# Patient Record
Sex: Female | Born: 1940 | Race: White | Hispanic: No | Marital: Married | Smoking: Never smoker
Health system: Southern US, Community
[De-identification: ages and names within clinical notes are randomized; demographics above are authoritative.]

## PROBLEM LIST (undated history)

## (undated) DIAGNOSIS — Z923 Personal history of irradiation: Secondary | ICD-10-CM

## (undated) DIAGNOSIS — K219 Gastro-esophageal reflux disease without esophagitis: Secondary | ICD-10-CM

## (undated) DIAGNOSIS — F419 Anxiety disorder, unspecified: Secondary | ICD-10-CM

## (undated) DIAGNOSIS — E039 Hypothyroidism, unspecified: Secondary | ICD-10-CM

## (undated) DIAGNOSIS — C4370 Malignant melanoma of unspecified lower limb, including hip: Secondary | ICD-10-CM

## (undated) DIAGNOSIS — K5790 Diverticulosis of intestine, part unspecified, without perforation or abscess without bleeding: Secondary | ICD-10-CM

## (undated) DIAGNOSIS — C73 Malignant neoplasm of thyroid gland: Secondary | ICD-10-CM

## (undated) DIAGNOSIS — C801 Malignant (primary) neoplasm, unspecified: Secondary | ICD-10-CM

## (undated) DIAGNOSIS — K279 Peptic ulcer, site unspecified, unspecified as acute or chronic, without hemorrhage or perforation: Secondary | ICD-10-CM

## (undated) HISTORY — PX: MELANOMA EXCISION: SHX5266

## (undated) HISTORY — PX: COLON SURGERY: SHX602

## (undated) HISTORY — PX: THYROIDECTOMY: SHX17

---

## 2005-01-25 ENCOUNTER — Ambulatory Visit: Payer: Self-pay | Admitting: Gastroenterology

## 2005-01-30 ENCOUNTER — Ambulatory Visit: Payer: Self-pay | Admitting: Gastroenterology

## 2005-03-30 ENCOUNTER — Ambulatory Visit: Payer: Self-pay | Admitting: General Surgery

## 2005-09-18 ENCOUNTER — Ambulatory Visit: Payer: Self-pay | Admitting: Family Medicine

## 2006-05-09 ENCOUNTER — Ambulatory Visit: Payer: Self-pay | Admitting: Unknown Physician Specialty

## 2007-06-17 ENCOUNTER — Ambulatory Visit: Payer: Self-pay | Admitting: Internal Medicine

## 2007-06-17 ENCOUNTER — Other Ambulatory Visit: Payer: Self-pay

## 2007-06-18 ENCOUNTER — Inpatient Hospital Stay: Payer: Self-pay | Admitting: Surgery

## 2008-01-02 ENCOUNTER — Ambulatory Visit: Payer: Self-pay | Admitting: Family Medicine

## 2009-02-21 ENCOUNTER — Ambulatory Visit: Payer: Self-pay | Admitting: Family Medicine

## 2009-10-03 ENCOUNTER — Ambulatory Visit: Payer: Self-pay | Admitting: Gastroenterology

## 2009-10-12 ENCOUNTER — Ambulatory Visit: Payer: Self-pay | Admitting: Gastroenterology

## 2009-11-01 ENCOUNTER — Ambulatory Visit: Payer: Self-pay | Admitting: Gastroenterology

## 2009-11-03 LAB — PATHOLOGY REPORT

## 2009-12-09 ENCOUNTER — Ambulatory Visit: Payer: Self-pay | Admitting: Gastroenterology

## 2010-03-30 ENCOUNTER — Ambulatory Visit: Payer: Self-pay | Admitting: Family Medicine

## 2010-09-27 ENCOUNTER — Ambulatory Visit: Payer: Self-pay | Admitting: Family Medicine

## 2010-10-11 ENCOUNTER — Ambulatory Visit: Payer: Self-pay | Admitting: Surgery

## 2011-04-20 ENCOUNTER — Ambulatory Visit: Payer: Self-pay | Admitting: Family Medicine

## 2011-06-14 ENCOUNTER — Other Ambulatory Visit: Payer: Self-pay | Admitting: Gastroenterology

## 2011-06-15 LAB — CLOSTRIDIUM DIFFICILE BY PCR

## 2011-06-22 ENCOUNTER — Ambulatory Visit: Payer: Self-pay | Admitting: Gastroenterology

## 2011-07-02 ENCOUNTER — Ambulatory Visit: Payer: Self-pay | Admitting: Gastroenterology

## 2012-01-29 ENCOUNTER — Ambulatory Visit: Payer: Self-pay | Admitting: Gastroenterology

## 2012-01-31 LAB — PATHOLOGY REPORT

## 2012-04-22 ENCOUNTER — Ambulatory Visit: Payer: Self-pay | Admitting: Pediatrics

## 2012-11-04 IMAGING — CT CT ABD-PELV W/O CM
1 of 2 series · 15 of 32 positions shown, 19 images · non-contrast
Comparison: none

REASON FOR EXAM: RLQ pain
COMMENTS:

PROCEDURE:     EKOMOBONG - EKOMOBONG ABDOMEN AND PELVIS WO  - July 02, 2011 [DATE]
RESULT:
TECHNIQUE: CT of the abdomen and pelvis is performed with oral contrast
only. Images are reconstructed at 3.0 mm slice thickness in the axial plane.

[Series 3: soft tissue · axial · 0.76mm/px · z∈[-423,-87]mm · 15 of 122 slices shown, 19 images]
[im 5/122  soft-tissue]
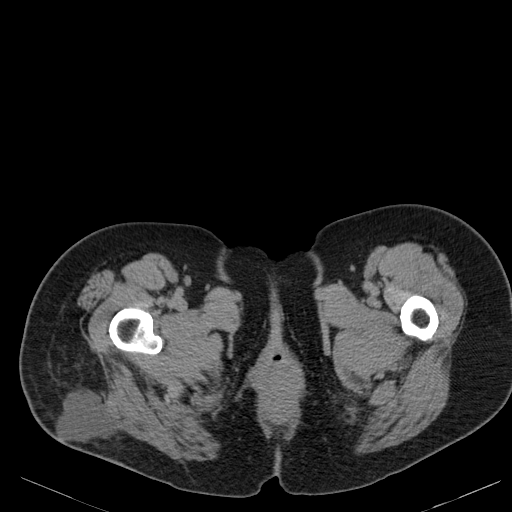
[im 5/122  bone]
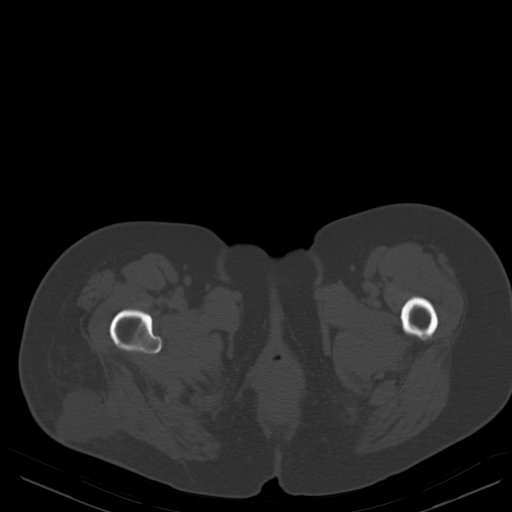
[im 15/122  soft-tissue]
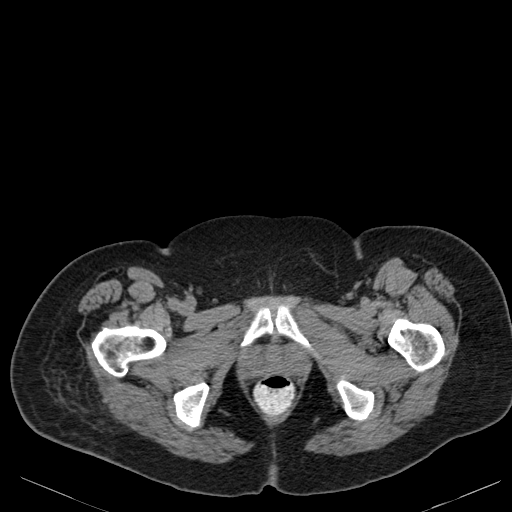
[im 25/122  soft-tissue]
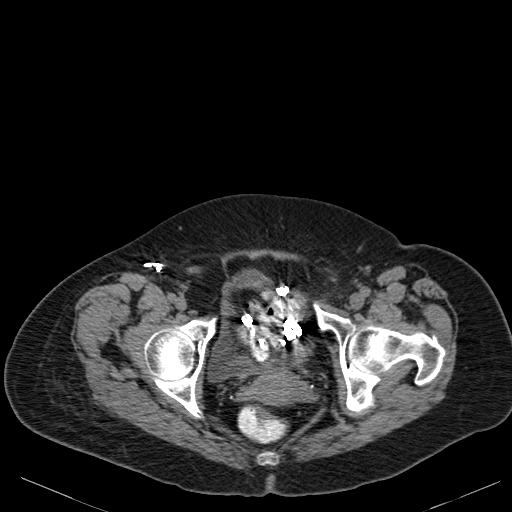
[im 34/122  soft-tissue]
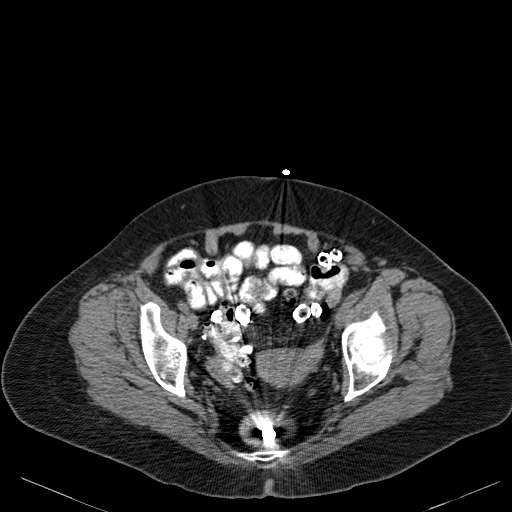
[im 44/122  soft-tissue]
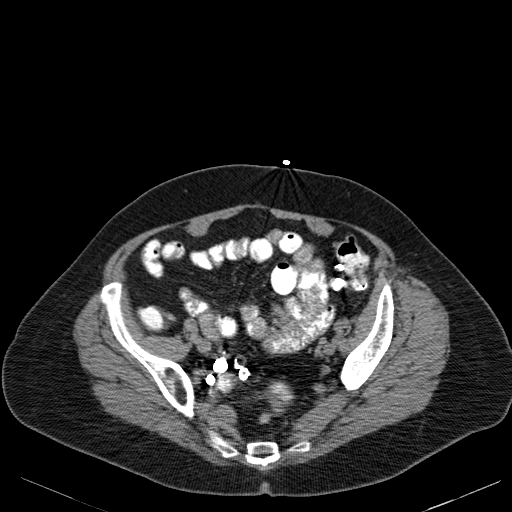
[im 54/122  soft-tissue]
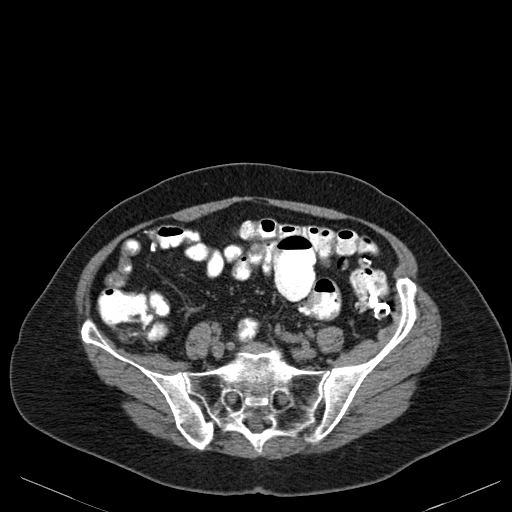
[im 63/122  soft-tissue]
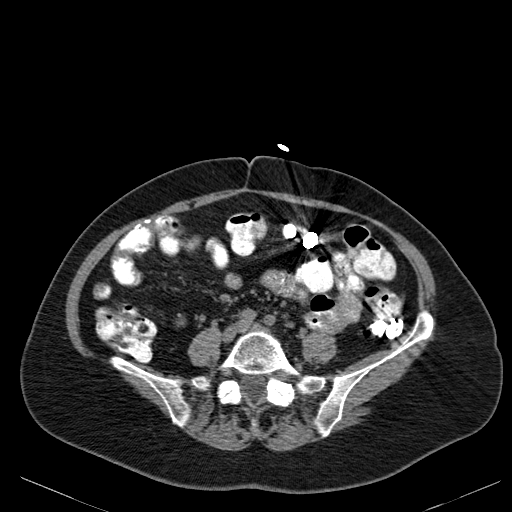
[im 68/122  soft-tissue]
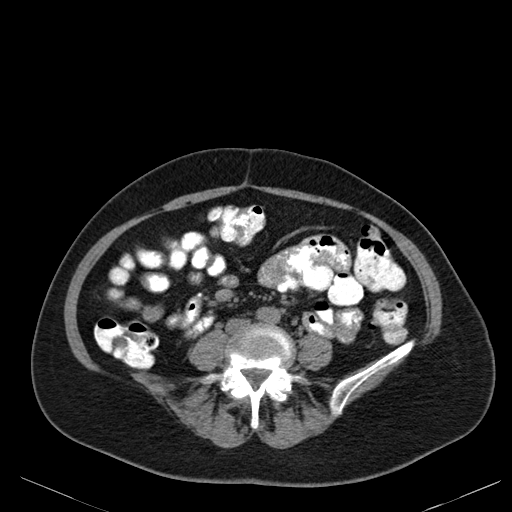
[im 78/122  soft-tissue]
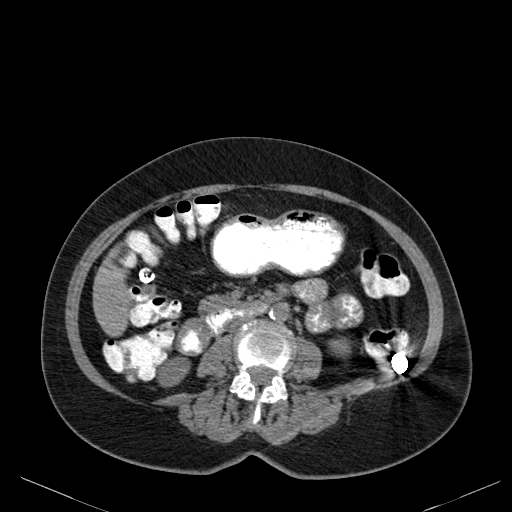
[im 78/122  bone]
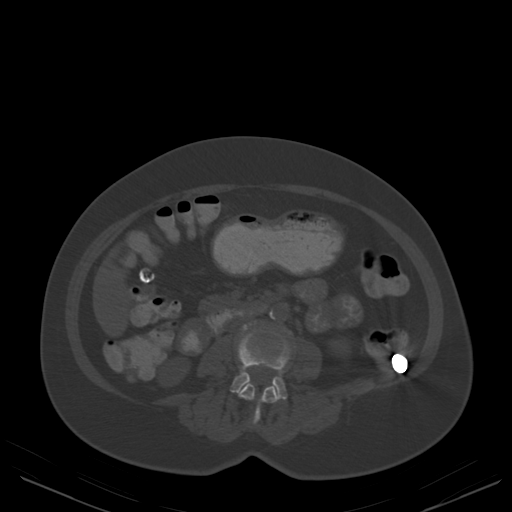
[im 88/122  soft-tissue]
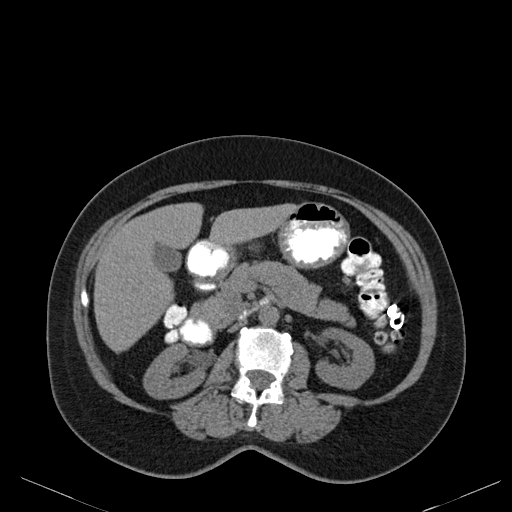
[im 97/122  soft-tissue]
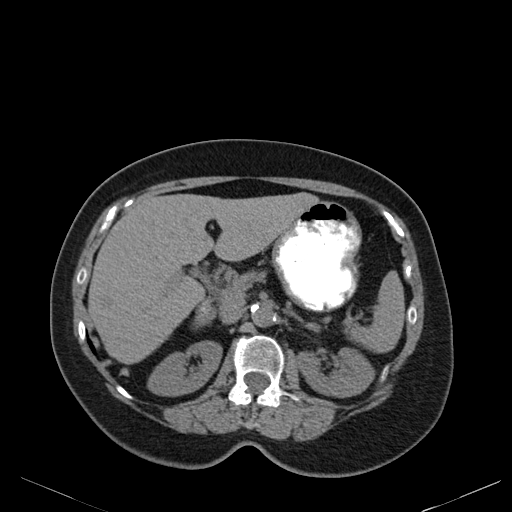
[im 102/122  lung]
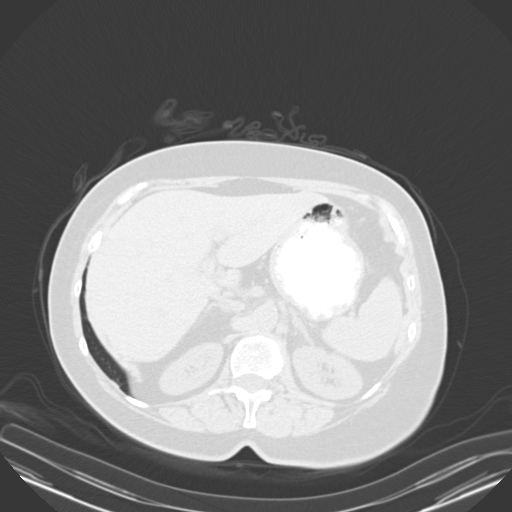
[im 107/122  soft-tissue]
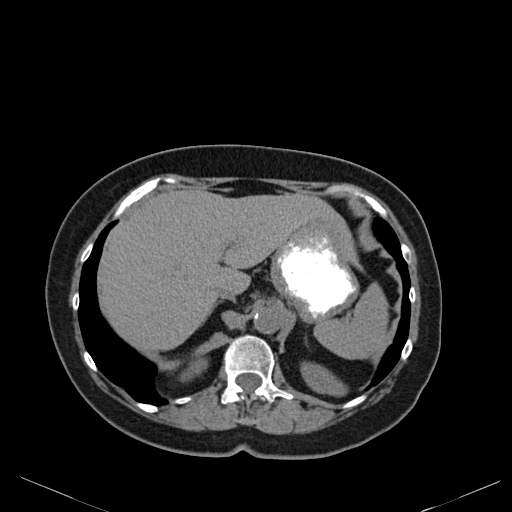
[im 107/122  lung]
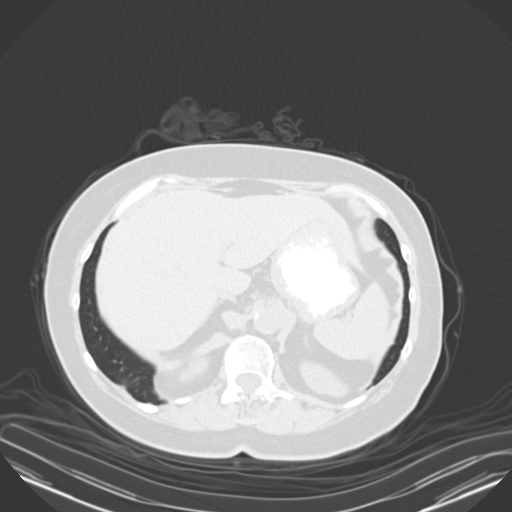
[im 112/122  lung]
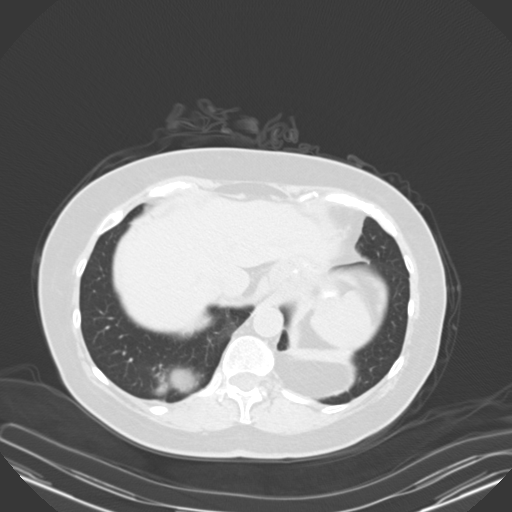
[im 117/122  soft-tissue]
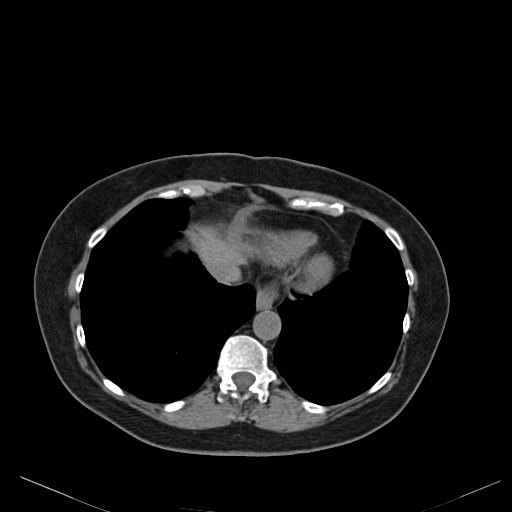
[im 117/122  lung]
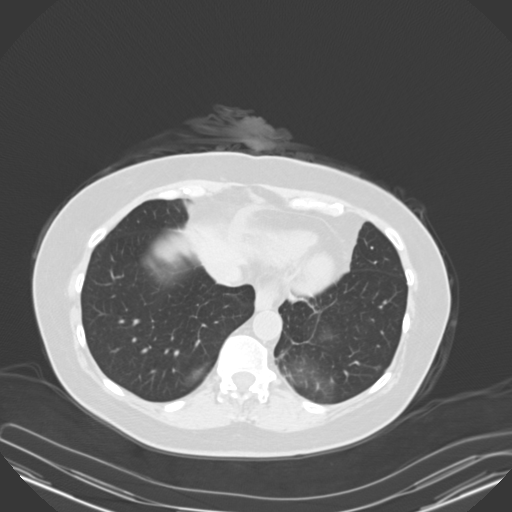

[15 of 32 positions shown; findings below may reference images not displayed]

There is residual barium from the Small Bowel Series performed on 22 June, 2011. This is predominantly in the sigmoid colon and rectum but also in some
colonic diverticula in the descending colon.

Images through the lung bases demonstrate no focal mass. There is no
infiltrate, edema or pneumothorax. Noncontrast images of the liver,
gallbladder, spleen and kidneys show no acute abnormality or focal abnormal
attenuation. The adrenal glands and aorta appear unremarkable. No pancreatic
mass or ductal dilation is evident. An extrarenal pelvis is present on the
left. No adenopathy is appreciated. A borderline enlarged lymph node is
present on image #52 with a short axis measurement of 7.2 mm diameter. An
atrophic uterus is present. No acute inflammation, ascites or abnormal fluid
collection is evident. Surgical clips are seen over the right groin region.
The bony structures appear unremarkable.
IMPRESSION: 1.     Colonic diverticulosis without evidence of acute diverticulitis.
2.     Slightly prominent retroperitoneal lymph nodes without adenopathy by
pathologic criteria.

## 2013-04-24 ENCOUNTER — Ambulatory Visit: Payer: Self-pay | Admitting: Pediatrics

## 2013-07-16 ENCOUNTER — Other Ambulatory Visit: Payer: Self-pay | Admitting: Gastroenterology

## 2013-07-16 LAB — CLOSTRIDIUM DIFFICILE(ARMC)

## 2013-07-19 LAB — STOOL CULTURE

## 2014-01-26 ENCOUNTER — Ambulatory Visit: Payer: Self-pay | Admitting: Pediatrics

## 2014-02-13 ENCOUNTER — Emergency Department: Payer: Self-pay | Admitting: Emergency Medicine

## 2014-06-11 ENCOUNTER — Ambulatory Visit: Payer: Self-pay | Admitting: Gastroenterology

## 2014-06-22 ENCOUNTER — Ambulatory Visit
Admit: 2014-06-22 | Disposition: A | Discharge status: Home / Self Care | Payer: Self-pay | Attending: Pediatrics | Admitting: Pediatrics

## 2014-10-18 ENCOUNTER — Encounter: Payer: Self-pay | Admitting: *Deleted

## 2014-10-19 ENCOUNTER — Ambulatory Visit: Payer: Medicare Other | Admitting: Anesthesiology

## 2014-10-19 ENCOUNTER — Encounter
Admission: RE | Disposition: A | Discharge status: Home / Self Care | Payer: Self-pay | Source: Ambulatory Visit | Attending: Gastroenterology

## 2014-10-19 ENCOUNTER — Encounter: Payer: Self-pay | Admitting: *Deleted

## 2014-10-19 ENCOUNTER — Ambulatory Visit
Admission: RE | Admit: 2014-10-19 | Discharge: 2014-10-19 | Disposition: A | Discharge status: Home / Self Care | Payer: Medicare Other | Source: Ambulatory Visit | Attending: Gastroenterology | Admitting: Gastroenterology

## 2014-10-19 DIAGNOSIS — Z85820 Personal history of malignant melanoma of skin: Secondary | ICD-10-CM | POA: Insufficient documentation

## 2014-10-19 DIAGNOSIS — Z79899 Other long term (current) drug therapy: Secondary | ICD-10-CM | POA: Insufficient documentation

## 2014-10-19 DIAGNOSIS — F419 Anxiety disorder, unspecified: Secondary | ICD-10-CM | POA: Diagnosis not present

## 2014-10-19 DIAGNOSIS — K224 Dyskinesia of esophagus: Secondary | ICD-10-CM | POA: Diagnosis not present

## 2014-10-19 DIAGNOSIS — K449 Diaphragmatic hernia without obstruction or gangrene: Secondary | ICD-10-CM | POA: Diagnosis not present

## 2014-10-19 DIAGNOSIS — K219 Gastro-esophageal reflux disease without esophagitis: Secondary | ICD-10-CM | POA: Diagnosis not present

## 2014-10-19 DIAGNOSIS — K317 Polyp of stomach and duodenum: Secondary | ICD-10-CM | POA: Diagnosis not present

## 2014-10-19 DIAGNOSIS — R1013 Epigastric pain: Secondary | ICD-10-CM | POA: Diagnosis not present

## 2014-10-19 DIAGNOSIS — K2970 Gastritis, unspecified, without bleeding: Secondary | ICD-10-CM | POA: Insufficient documentation

## 2014-10-19 DIAGNOSIS — Z85850 Personal history of malignant neoplasm of thyroid: Secondary | ICD-10-CM | POA: Diagnosis not present

## 2014-10-19 DIAGNOSIS — R1031 Right lower quadrant pain: Secondary | ICD-10-CM | POA: Diagnosis present

## 2014-10-19 DIAGNOSIS — Z880 Allergy status to penicillin: Secondary | ICD-10-CM | POA: Insufficient documentation

## 2014-10-19 DIAGNOSIS — R197 Diarrhea, unspecified: Secondary | ICD-10-CM | POA: Insufficient documentation

## 2014-10-19 DIAGNOSIS — K5730 Diverticulosis of large intestine without perforation or abscess without bleeding: Secondary | ICD-10-CM | POA: Diagnosis not present

## 2014-10-19 DIAGNOSIS — E039 Hypothyroidism, unspecified: Secondary | ICD-10-CM | POA: Diagnosis not present

## 2014-10-19 HISTORY — DX: Malignant neoplasm of thyroid gland: C73

## 2014-10-19 HISTORY — DX: Hypothyroidism, unspecified: E03.9

## 2014-10-19 HISTORY — DX: Peptic ulcer, site unspecified, unspecified as acute or chronic, without hemorrhage or perforation: K27.9

## 2014-10-19 HISTORY — DX: Gastro-esophageal reflux disease without esophagitis: K21.9

## 2014-10-19 HISTORY — DX: Malignant (primary) neoplasm, unspecified: C80.1

## 2014-10-19 HISTORY — DX: Anxiety disorder, unspecified: F41.9

## 2014-10-19 HISTORY — PX: COLONOSCOPY WITH PROPOFOL: SHX5780

## 2014-10-19 HISTORY — PX: ESOPHAGOGASTRODUODENOSCOPY: SHX5428

## 2014-10-19 HISTORY — DX: Diverticulosis of intestine, part unspecified, without perforation or abscess without bleeding: K57.90

## 2014-10-19 HISTORY — DX: Malignant melanoma of unspecified lower limb, including hip: C43.70

## 2014-10-19 SURGERY — COLONOSCOPY WITH PROPOFOL
Anesthesia: General

## 2014-10-19 MED ORDER — MIDAZOLAM HCL 5 MG/5ML IJ SOLN
INTRAMUSCULAR | Status: DC | PRN
Start: 2014-10-19 — End: 2014-10-19
  Administered 2014-10-19: 1 mg via INTRAVENOUS

## 2014-10-19 MED ORDER — SODIUM CHLORIDE 0.9 % IV SOLN
INTRAVENOUS | Status: DC
  Administered 2014-10-19: 10:00:00 via INTRAVENOUS

## 2014-10-19 MED ORDER — PROPOFOL INFUSION 10 MG/ML OPTIME
INTRAVENOUS | Status: DC | PRN
Start: 2014-10-19 — End: 2014-10-19
  Administered 2014-10-19: 160 ug/kg/min via INTRAVENOUS

## 2014-10-19 MED ORDER — GLYCOPYRROLATE 0.2 MG/ML IJ SOLN
INTRAMUSCULAR | Status: DC | PRN
  Administered 2014-10-19: 0.2 mg via INTRAVENOUS

## 2014-10-19 MED ORDER — SODIUM CHLORIDE 0.9 % IV SOLN
INTRAVENOUS | Status: DC
Start: 2014-10-19 — End: 2014-10-19
  Administered 2014-10-19: 12:00:00 via INTRAVENOUS

## 2014-10-19 MED ORDER — FENTANYL CITRATE (PF) 100 MCG/2ML IJ SOLN
INTRAMUSCULAR | Status: DC | PRN
  Administered 2014-10-19: 50 ug via INTRAVENOUS

## 2014-10-19 MED ORDER — EPHEDRINE SULFATE 50 MG/ML IJ SOLN
INTRAMUSCULAR | Status: DC | PRN
  Administered 2014-10-19: 10 mg via INTRAVENOUS

## 2014-10-19 MED ORDER — PROPOFOL 10 MG/ML IV BOLUS
INTRAVENOUS | Status: DC | PRN
  Administered 2014-10-19: 50 mg via INTRAVENOUS
  Administered 2014-10-19: 30 mg via INTRAVENOUS

## 2014-10-19 NOTE — Transfer of Care (Signed)
Immediate Anesthesia Transfer of Care Note  Patient: Savannah Riley  Procedure(s) Performed: Procedure(s): COLONOSCOPY WITH PROPOFOL (N/A) ESOPHAGOGASTRODUODENOSCOPY (EGD) (N/A)  Patient Location:    Anesthesia Type:General  Level of Consciousness: sedated  Airway & Oxygen Therapy: Patient Spontanous Breathing and Patient connected to nasal cannula oxygen  Post-op Assessment: Report given to RN and Post -op Vital signs reviewed and stable  Post vital signs: Reviewed and stable  Last Vitals:  Filed Vitals:   10/19/14 1225  BP:   Pulse: 95  Temp: 35.7 C  Resp: 12    Complications: No apparent anesthesia complications

## 2014-10-19 NOTE — Anesthesia Preprocedure Evaluation (Signed)
Anesthesia Evaluation  Patient identified by MRN, date of birth, ID band Patient awake    Reviewed: Allergy & Precautions, NPO status , Patient's Chart, lab work & pertinent test results  History of Anesthesia Complications Negative for: history of anesthetic complications  Airway Mallampati: III       Dental  (+) Teeth Intact, Caps   Pulmonary neg pulmonary ROS,    Pulmonary exam normal       Cardiovascular negative cardio ROS Normal cardiovascular exam    Neuro/Psych    GI/Hepatic Neg liver ROS, PUD, GERD-  Medicated,  Endo/Other  Hypothyroidism   Renal/GU negative Renal ROS     Musculoskeletal negative musculoskeletal ROS (+)   Abdominal Normal abdominal exam  (+)   Peds negative pediatric ROS (+)  Hematology negative hematology ROS (+)   Anesthesia Other Findings   Reproductive/Obstetrics negative OB ROS                             Anesthesia Physical Anesthesia Plan  ASA: II  Anesthesia Plan: General   Post-op Pain Management:    Induction: Intravenous  Airway Management Planned: Nasal Cannula  Additional Equipment:   Intra-op Plan:   Post-operative Plan:   Informed Consent: I have reviewed the patients History and Physical, chart, labs and discussed the procedure including the risks, benefits and alternatives for the proposed anesthesia with the patient or authorized representative who has indicated his/her understanding and acceptance.     Plan Discussed with: CRNA  Anesthesia Plan Comments:         Anesthesia Quick Evaluation

## 2014-10-19 NOTE — Op Note (Signed)
[facility] Gastroenterology Patient Name: Savannah Riley Procedure Date: 10/19/2014 11:44 AM MRN: [id number] Account #: [id number] Date of Birth: [birth year] Admit Type: Outpatient Age: 74 Room: [facility] ENDO ROOM 3 Gender: Female Note Status: Finalized Procedure:         Upper GI endoscopy Indications:       Dyspepsia, Diarrhea Providers:         Lollie Sails, MD Referring MD:      Shonna Chock, MD (Referring MD) Medicines:         Monitored Anesthesia Care Complications:     No immediate complications. Procedure:         Pre-Anesthesia Assessment:                    - ASA Grade Assessment: III - A patient with severe                     systemic disease.                    After obtaining informed consent, the endoscope was passed                     under direct vision. Throughout the procedure, the                     patient's blood pressure, pulse, and oxygen saturations                     were monitored continuously. The Olympus GIF-160 endoscope                     (S#. [id number]) was introduced through the mouth, and                     advanced to the fourth part of duodenum. The upper GI                     endoscopy was accomplished without difficulty. The patient                     tolerated the procedure well. Findings:      Abnormal motility was noted in the middle third of the esophagus and in       the lower third of the esophagus. The cricopharyngeus was normal. There       is spasticity of the esophageal body. The distal esophagus/lower       esophageal sphincter is open. Tertiary peristaltic waves are noted.      A small hiatus hernia was found. The Z-line was a variable distance from       incisors; the hiatal hernia was sliding. Biopsies were taken with a cold       forceps for histology.      Diffuse minimal inflammation characterized by erythema was found in the       gastric body.      Biopsies were taken with a cold forceps in the gastric  body and in the       gastric antrum for histology.      A single 3 mm sessile polyp with no bleeding and stigmata of recent       bleeding was found on the anterior wall of the gastric body. The polyp       was removed with a cold biopsy forceps. Resection and retrieval  were       complete.      The exam of the stomach was otherwise normal.      Diffuse mucosal flattening was found in the entire examined duodenum.       This was biopsied with a cold forceps for histology. Impression:        - Esophageal motility disorder.                    - Small hiatus hernia. Biopsied.                    - Bile gastritis.                    - A single gastric polyp. Resected and retrieved.                    - Flattened mucosa was found in the duodenum, not                     consistent with celiac disease. Biopsied.                    - Biopsies were taken with a cold forceps for histology in                     the gastric body and in the gastric antrum. Recommendation:    - Await pathology results.                    - Discharge patient to home.                    - Use sucralfate suspension 1 gram PO QID daily.                    - Use Prilosec (omeprazole) 20 mg PO daily daily. Procedure Code(s): --- Professional ---                    [phone], Esophagogastroduodenoscopy, flexible, transoral;                     with biopsy, single or multiple Diagnosis Code(s): --- Professional ---                    530.5, Dyskinesia of esophagus                    535.40, Other specified gastritis, without mention of                     hemorrhage                    211.1, Benign neoplasm of stomach                    537.9, Unspecified disorder of stomach and duodenum                    536.8, Dyspepsia and other specified disorders of function                     of stomach                    787.91, Diarrhea  553.3, Diaphragmatic hernia without mention of obstruction                      or gangrene CPT copyright 2014 American Medical Association. All rights reserved. The codes documented in this report are preliminary and upon coder review may  be revised to meet current compliance requirements. Lollie Sails, MD 10/19/2014 12:09:43 PM This report has been signed electronically. Number of Addenda: 0 Note Initiated On: 10/19/2014 11:44 AM      [facility]

## 2014-10-19 NOTE — H&P (Signed)
Outpatient short stay form Pre-procedure 10/19/2014 11:30 AM Savannah Sails MD  Primary Physician: Dr. 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 She has been relatively stable and these regards however as had increasing issues with diarrhea. Her last chromogranin A was only slightly elevated from her previous. She has been following with her visions at [facility] in this regard. There's been mesenteric lymphadenopathy. She has been taking some Carafate which has helped with the diarrhea. He also takes when necessary Bentyl. She has not been on the omeprazole recently due to the need for rechecking the chromogranin A.  She tolerated her prep well. He does not take any aspirin or NSAID type products or anticoagulates medications.    Current facility-administered medications:  .  0.9 %  sodium chloride infusion, , Intravenous, Continuous, Savannah Sails, MD, Last Rate: 20 mL/hr at 10/19/14 1022 .  0.9 %  sodium chloride infusion, , Intravenous, Continuous, Savannah Sails, MD  Prescriptions prior to admission  Medication Sig Dispense Refill Last Dose  . ALPRAZolam (XANAX) 0.5 MG tablet Take 0.5 mg by mouth daily as needed for anxiety.   10/18/2014 at Unknown time  . ALPRAZolam (XANAX) 0.5 MG tablet Take 0.5 mg by mouth at bedtime as needed for anxiety.   10/18/2014 at 2100  . dicyclomine (BENTYL) 10 MG capsule Take 10 mg by mouth 4 (four) times daily as needed for spasms.   Past Month at Unknown time  . levothyroxine (SYNTHROID, LEVOTHROID) 100 MCG tablet Take 100 mcg by mouth daily before breakfast.   10/19/2014 at 0600  . Multiple Vitamin (MULTIVITAMIN) tablet Take 1 tablet by mouth daily.   Past Week at Unknown time  . sucralfate (CARAFATE) 1 GM/10ML suspension Take 1 g by mouth 4 (four) times daily -  with meals and at  bedtime.   10/17/2014  . triamcinolone cream (KENALOG) 0.1 % Apply 1 application topically 2 (two) times daily as needed.   Past Month at Unknown time  . omeprazole (PRILOSEC) 20 MG capsule Take 20 mg by mouth daily.   Completed Course at Unknown time     Allergies  Allergen Reactions  . Ivp Dye [Iodinated Diagnostic Agents]   . Novocain [Procaine]   . Other   . Penicillins   . Phenergan [Promethazine Hcl]   . Prednisone      Past Medical History  Diagnosis Date  . Cancer     Carcinoid tumor of small intestine, malignant   . Papillary carcinoma of thyroid   . Melanoma of lower limb   . Hypothyroidism   . GERD (gastroesophageal reflux disease)   . Peptic ulcer disease   . Anxiety   . Diverticulosis     Review of systems:      Physical Exam    Heart and lungs: Regular rate and rhythm without rub or gallop, lungs are bilaterally clear    HEENT: Normocephalic atraumatic eyes are anicteric    Other:     Pertinant exam for procedure: Soft mild tenderness to palpation right lower quadrant. Bowel sounds are positive normoactive. There are no masses or organomegaly noted.    Planned proceedures: EGD and colonoscopy with indicated procedures I have discussed the risks benefits and complications of procedures to include not limited to bleeding, infection, perforation and the risk of sedation and the patient wishes to  proceed.    Savannah Sails, MD Gastroenterology 10/19/2014  11:30 AM

## 2014-10-19 NOTE — Op Note (Signed)
[facility] Gastroenterology Patient Name: Savannah Riley Procedure Date: 10/19/2014 11:42 AM MRN: [id number] Account #: [id number] Date of Birth: [birth year] Admit Type: Outpatient Age: 74 Room: [facility] ENDO ROOM 3 Gender: Female Note Status: Finalized Procedure:         Colonoscopy Indications:       Abdominal pain in the right lower quadrant, Clinically                     significant diarrhea of unexplained origin Providers:         Lollie Sails, MD Referring MD:      Shonna Chock, MD (Referring MD) Medicines:         Monitored Anesthesia Care Complications:     No immediate complications. Procedure:         Pre-Anesthesia Assessment:                    - ASA Grade Assessment: III - A patient with severe                     systemic disease.                    After obtaining informed consent, the colonoscope was                     passed under direct vision. Throughout the procedure, the                     patient's blood pressure, pulse, and oxygen saturations                     were monitored continuously. The Colonoscope was                     introduced through the anus with the intention of                     advancing to the ileum. The scope was advanced to the                     splenic flexure before the procedure was aborted.                     Medications were given. The colonoscopy was unusually                     difficult due to poor bowel prep with stool present. The                     quality of the bowel preparation was poor. Findings:      Multiple small and large-mouthed diverticula were found in the sigmoid       colon and in the descending colon.      The digital rectal exam was normal. Impression:        - Preparation of the colon was poor.                    - Diverticulosis in the sigmoid colon and in the                     descending colon.                    - No specimens collected. Recommendation:    -  Put patient on a  clear liquid diet starting today.                     Reschedule for proceedure in 2 days. Lollie Sails, MD 10/19/2014 12:23:28 PM This report has been signed electronically. Number of Addenda: 0 Note Initiated On: 10/19/2014 11:42 AM Total Procedure Duration: 0 hours 7 minutes 21 seconds       [facility]

## 2014-10-19 NOTE — Anesthesia Postprocedure Evaluation (Signed)
  Anesthesia Post-op Note  Patient: Savannah Riley  Procedure(s) Performed: Procedure(s): COLONOSCOPY WITH PROPOFOL (N/A) ESOPHAGOGASTRODUODENOSCOPY (EGD) (N/A)  Anesthesia type:General  Patient location: PACU  Post pain: Pain level controlled  Post assessment: Post-op Vital signs reviewed, Patient's Cardiovascular Status Stable, Respiratory Function Stable, Patent Airway and No signs of Nausea or vomiting  Post vital signs: Reviewed and stable  Last Vitals:  Filed Vitals:   10/19/14 1240  BP: 102/61  Pulse: 99  Temp:   Resp: 17    Level of consciousness: awake, alert  and patient cooperative  Complications: No apparent anesthesia complications

## 2014-10-20 ENCOUNTER — Encounter: Payer: Self-pay | Admitting: *Deleted

## 2014-10-20 LAB — SURGICAL PATHOLOGY

## 2014-10-21 ENCOUNTER — Ambulatory Visit: Payer: Medicare Other | Admitting: Anesthesiology

## 2014-10-21 ENCOUNTER — Encounter
Admission: RE | Disposition: A | Discharge status: Home / Self Care | Payer: Self-pay | Source: Ambulatory Visit | Attending: Gastroenterology

## 2014-10-21 ENCOUNTER — Encounter: Payer: Self-pay | Admitting: *Deleted

## 2014-10-21 ENCOUNTER — Ambulatory Visit
Admission: RE | Admit: 2014-10-21 | Discharge: 2014-10-21 | Disposition: A | Discharge status: Home / Self Care | Payer: Medicare Other | Source: Ambulatory Visit | Attending: Gastroenterology | Admitting: Gastroenterology

## 2014-10-21 DIAGNOSIS — K5730 Diverticulosis of large intestine without perforation or abscess without bleeding: Secondary | ICD-10-CM | POA: Diagnosis not present

## 2014-10-21 DIAGNOSIS — R1031 Right lower quadrant pain: Secondary | ICD-10-CM | POA: Diagnosis present

## 2014-10-21 DIAGNOSIS — K648 Other hemorrhoids: Secondary | ICD-10-CM | POA: Insufficient documentation

## 2014-10-21 HISTORY — PX: COLONOSCOPY WITH PROPOFOL: SHX5780

## 2014-10-21 SURGERY — COLONOSCOPY WITH PROPOFOL
Anesthesia: General

## 2014-10-21 MED ORDER — SODIUM CHLORIDE 0.9 % IV SOLN: INTRAVENOUS | Status: DC

## 2014-10-21 MED ORDER — PROPOFOL 10 MG/ML IV BOLUS
INTRAVENOUS | Status: DC | PRN
  Administered 2014-10-21: 70 mg via INTRAVENOUS
  Administered 2014-10-21: 20 mg via INTRAVENOUS
  Administered 2014-10-21: 40 mg via INTRAVENOUS

## 2014-10-21 MED ORDER — SODIUM CHLORIDE 0.9 % IV SOLN
INTRAVENOUS | Status: DC
  Administered 2014-10-21 (×2): via INTRAVENOUS

## 2014-10-21 MED ORDER — PROPOFOL INFUSION 10 MG/ML OPTIME
INTRAVENOUS | Status: DC | PRN
  Administered 2014-10-21: 100 ug/kg/min via INTRAVENOUS

## 2014-10-21 NOTE — Op Note (Signed)
[facility] Gastroenterology Patient Name: Savannah Riley Procedure Date: 10/21/2014 12:49 PM MRN: [id number] Account #: [id number] Date of Birth: Nov 27, 1940 Admit Type: Outpatient Age: 74 Room: [facility] ENDO ROOM 2 Gender: Female Note Status: Finalized Procedure:         Colonoscopy Indications:       Abdominal pain in the right lower quadrant Providers:         Lollie Sails, MD Referring MD:      Shonna Chock, MD (Referring MD) Medicines:         Monitored Anesthesia Care Complications:     No immediate complications. Mild barotrauma limited to the                     mucosa noted in the proximal ascending and cecum. Procedure:         Pre-Anesthesia Assessment:                    - ASA Grade Assessment: III - A patient with severe                     systemic disease.                    After obtaining informed consent, the colonoscope was                     passed under direct vision. Throughout the procedure, the                     patient's blood pressure, pulse, and oxygen saturations                     were monitored continuously. The Colonoscope was                     introduced through the anus and advanced to the the cecum,                     identified by appendiceal orifice and ileocecal valve. The                     colonoscopy was performed with moderate difficulty due to                     a tortuous colon. The patient tolerated the procedure                     well. The quality of the bowel preparation was good. Findings:      Many small and large-mouthed diverticula were found in the sigmoid       colon, in the descending colon and at the splenic flexure.      The sigmoid colon, distal descending colon and splenic flexure were       moderately tortuous.      The IC valve was turned open and noted to be normal, but I was unable to       fully intubate.      Non-bleeding internal hemorrhoids were found during retroflexion. The   hemorrhoids were medium-sized.      The digital rectal exam was normal.      Biopsies for histology were taken with a cold forceps from the right       colon and left colon for evaluation of microscopic colitis. Impression:        -  Diverticulosis in the sigmoid colon, in the descending                     colon and at the splenic flexure.                    - Tortuous colon.                    - Non-bleeding internal hemorrhoids. Recommendation:    - Await pathology results.                    - Return to GI clinic in 3 weeks.                    - Use Citrucel one tablespoon PO daily. Procedure Code(s): --- Professional ---                    [phone], Colonoscopy, flexible; with biopsy, single or                     multiple Diagnosis Code(s): --- Professional ---                    455.0, Internal hemorrhoids without mention of complication                    789.03, Abdominal pain, right lower quadrant                    562.10, Diverticulosis of colon (without mention of                     hemorrhage)                    751.5, Other anomalies of intestine CPT copyright 2014 American Medical Association. All rights reserved. The codes documented in this report are preliminary and upon coder review may  be revised to meet current compliance requirements. Lollie Sails, MD 10/21/2014 2:19:09 PM This report has been signed electronically. Number of Addenda: 0 Note Initiated On: 10/21/2014 12:49 PM Scope Withdrawal Time: 0 hours 9 minutes 59 seconds  Total Procedure Duration: 0 hours 21 minutes 12 seconds       [facility]

## 2014-10-21 NOTE — H&P (Signed)
Outpatient short stay form Pre-procedure 10/21/2014 1:04 PM Lollie Sails MD  Primary Physician: Dr Juanell Fairly  Reason for visit:  Colonoscopy  History of present illness: Patient is a 74 year old female presenting today for colonoscopy. She has a history of mesenteric carcinoid removed via surgery in 2007. Since that time she's had a chronic right-sided abdominal pain however she's had increasing problems with diarrhea recently. Her chromogranin A has been relatively stable.  She presented yesterday however was not good and needed to be repeated overnight. She tolerated the prep well.      Current facility-administered medications:  .  0.9 %  sodium chloride infusion, , Intravenous, Continuous, Lollie Sails, MD .  0.9 %  sodium chloride infusion, , Intravenous, Continuous, Lollie Sails, MD  Prescriptions prior to admission  Medication Sig Dispense Refill Last Dose  . ALPRAZolam (XANAX) 0.5 MG tablet Take 0.5 mg by mouth daily as needed for anxiety.   Past Week at Unknown time  . ALPRAZolam (XANAX) 0.5 MG tablet Take 0.5 mg by mouth at bedtime as needed for anxiety.   10/20/2014 at 800pm  . dicyclomine (BENTYL) 10 MG capsule Take 10 mg by mouth 4 (four) times daily as needed for spasms.   Past Week at Unknown time  . levothyroxine (SYNTHROID, LEVOTHROID) 100 MCG tablet Take 100 mcg by mouth daily before breakfast.   10/21/2014 at 530am  . Multiple Vitamin (MULTIVITAMIN) tablet Take 1 tablet by mouth daily.   Past Week at Unknown time  . omeprazole (PRILOSEC) 20 MG capsule Take 20 mg by mouth daily.   Past Week at Unknown time  . sucralfate (CARAFATE) 1 GM/10ML suspension Take 1 g by mouth 4 (four) times daily -  with meals and at bedtime.   10/20/2014 at 800pmUnknown time  . triamcinolone cream (KENALOG) 0.1 % Apply 1 application topically 2 (two) times daily as needed.   Past Week at Unknown time     Allergies  Allergen Reactions  . Ivp Dye [Iodinated Diagnostic Agents]   .  Novocain [Procaine]   . Other   . Penicillins   . Phenergan [Promethazine Hcl]   . Prednisone      Past Medical History  Diagnosis Date  . Cancer     Carcinoid tumor of small intestine, malignant   . Papillary carcinoma of thyroid   . Melanoma of lower limb   . Hypothyroidism   . GERD (gastroesophageal reflux disease)   . Peptic ulcer disease   . Anxiety   . Diverticulosis     Review of systems:      Physical Exam    Heart and lungs: Regular rate and rhythm without rub or gallop, lungs are bilaterally clear    HEENT: Normocephalic atraumatic eyes are anicteric    Other:     Pertinant exam for procedure: Soft. Nondistended, mild tenderness to palpation in the right lower quadrant. No masses or rebound. Bowel sounds are positive normoactive    Planned proceedures: Colonoscopy and indicated procedures I have discussed the risks benefits and complications of procedures to include not limited to bleeding, infection, perforation and the risk of sedation and the patient wishes to proceed.    Lollie Sails, MD Gastroenterology 10/21/2014  1:04 PM

## 2014-10-21 NOTE — Anesthesia Preprocedure Evaluation (Signed)
Anesthesia Evaluation  Patient identified by MRN, date of birth, ID band Patient awake    Reviewed: Allergy & Precautions, H&P , NPO status , Patient's Chart, lab work & pertinent test results, reviewed documented beta blocker date and time   Airway Mallampati: II  TM Distance: >3 FB Neck ROM: full    Dental no notable dental hx.  Crowns in the top front:   Pulmonary neg shortness of breath, asthma (as a child) , neg sleep apnea, neg recent URI,  breath sounds clear to auscultation  Pulmonary exam normal       Cardiovascular Exercise Tolerance: Good - angina- CAD, - Past MI and - CABG Normal cardiovascular exam+ dysrhythmias (asymptomatic) - Valvular Problems/MurmursRhythm:regular Rate:Normal     Neuro/Psych PSYCHIATRIC DISORDERS (anxiety) negative neurological ROS     GI/Hepatic PUD, GERD-  Medicated and Controlled,Hemangiomas    Endo/Other  neg diabetesHypothyroidism   Renal/GU Kidney stones  negative genitourinary   Musculoskeletal   Abdominal   Peds  Hematology negative hematology ROS (+)   Anesthesia Other Findings Past Medical History:   Cancer                                                         Comment:Carcinoid tumor of small intestine, malignant    Papillary carcinoma of thyroid                               Melanoma of lower limb                                       Hypothyroidism                                               GERD (gastroesophageal reflux disease)                       Peptic ulcer disease                                         Anxiety                                                      Diverticulosis                                               Reproductive/Obstetrics negative OB ROS                             Anesthesia Physical Anesthesia Plan  ASA: III  Anesthesia Plan: General   Post-op Pain Management:    Induction:   Airway  Management  Planned:   Additional Equipment:   Intra-op Plan:   Post-operative Plan:   Informed Consent: I have reviewed the patients History and Physical, chart, labs and discussed the procedure including the risks, benefits and alternatives for the proposed anesthesia with the patient or authorized representative who has indicated his/her understanding and acceptance.   Dental Advisory Given  Plan Discussed with: Anesthesiologist, CRNA and Surgeon  Anesthesia Plan Comments:         Anesthesia Quick Evaluation

## 2014-10-22 ENCOUNTER — Encounter: Payer: Self-pay | Admitting: Gastroenterology

## 2014-10-22 NOTE — Transfer of Care (Signed)
Immediate Anesthesia Transfer of Care Note  Patient: Savannah Riley  Procedure(s) Performed: Procedure(s): COLONOSCOPY WITH PROPOFOL (N/A)  Patient Location: Endoscopy Unit  Anesthesia Type:General  Level of Consciousness: awake and alert   Airway & Oxygen Therapy: Patient Spontanous Breathing and Patient connected to nasal cannula oxygen  Post-op Assessment: Report given to RN  Post vital signs: Reviewed and stable  Last Vitals:  Filed Vitals:   10/21/14 1444  BP: 133/65  Pulse: 68  Temp:   Resp: 17    Complications: No apparent anesthesia complications

## 2014-10-22 NOTE — Anesthesia Postprocedure Evaluation (Signed)
  Anesthesia Post-op Note  Patient: Savannah Riley  Procedure(s) Performed: Procedure(s): COLONOSCOPY WITH PROPOFOL (N/A)  Anesthesia type:General  Patient location: PACU  Post pain: Pain level controlled  Post assessment: Post-op Vital signs reviewed, Patient's Cardiovascular Status Stable, Respiratory Function Stable, Patent Airway and No signs of Nausea or vomiting  Post vital signs: Reviewed and stable  Last Vitals:  Filed Vitals:   10/21/14 1444  BP: 133/65  Pulse: 68  Temp:   Resp: 17    Level of consciousness: awake, alert  and patient cooperative  Complications: No apparent anesthesia complications

## 2014-10-25 LAB — SURGICAL PATHOLOGY

## 2015-04-13 ENCOUNTER — Encounter: Payer: Self-pay | Admitting: *Deleted

## 2015-04-13 ENCOUNTER — Emergency Department: Payer: Medicare Other

## 2015-04-13 ENCOUNTER — Ambulatory Visit
Admission: EM | Admit: 2015-04-13 | Discharge: 2015-04-13 | Disposition: A | Discharge status: Other Acute Inpatient Hospital | Payer: Medicare Other | Source: Home / Self Care

## 2015-04-13 ENCOUNTER — Encounter: Payer: Self-pay | Admitting: Emergency Medicine

## 2015-04-13 ENCOUNTER — Emergency Department
Admission: EM | Admit: 2015-04-13 | Discharge: 2015-04-13 | Disposition: A | Discharge status: Home / Self Care | Payer: Medicare Other | Attending: Emergency Medicine | Admitting: Emergency Medicine

## 2015-04-13 DIAGNOSIS — Z85850 Personal history of malignant neoplasm of thyroid: Secondary | ICD-10-CM | POA: Insufficient documentation

## 2015-04-13 DIAGNOSIS — Z85820 Personal history of malignant melanoma of skin: Secondary | ICD-10-CM | POA: Insufficient documentation

## 2015-04-13 DIAGNOSIS — E039 Hypothyroidism, unspecified: Secondary | ICD-10-CM

## 2015-04-13 DIAGNOSIS — Z79899 Other long term (current) drug therapy: Secondary | ICD-10-CM | POA: Diagnosis not present

## 2015-04-13 DIAGNOSIS — K219 Gastro-esophageal reflux disease without esophagitis: Secondary | ICD-10-CM

## 2015-04-13 DIAGNOSIS — R079 Chest pain, unspecified: Secondary | ICD-10-CM | POA: Insufficient documentation

## 2015-04-13 DIAGNOSIS — Z880 Allergy status to penicillin: Secondary | ICD-10-CM | POA: Insufficient documentation

## 2015-04-13 LAB — CBC WITH DIFFERENTIAL/PLATELET
Basophils Absolute: 0 10*3/uL (ref 0–0.1)
Basophils Relative: 1 %
EOS PCT: 2 %
Eosinophils Absolute: 0.1 10*3/uL (ref 0–0.7)
HCT: 37.6 % (ref 35.0–47.0)
Hemoglobin: 12.5 g/dL (ref 12.0–16.0)
LYMPHS PCT: 26 %
Lymphs Abs: 1.1 10*3/uL (ref 1.0–3.6)
MCH: 28 pg (ref 26.0–34.0)
MCHC: 33.2 g/dL (ref 32.0–36.0)
MCV: 84.4 fL (ref 80.0–100.0)
MONO ABS: 0.4 10*3/uL (ref 0.2–0.9)
MONOS PCT: 9 %
Neutro Abs: 2.7 10*3/uL (ref 1.4–6.5)
Neutrophils Relative %: 62 %
PLATELETS: 249 10*3/uL (ref 150–440)
RBC: 4.46 MIL/uL (ref 3.80–5.20)
RDW: 15.3 % — ABNORMAL HIGH (ref 11.5–14.5)
WBC: 4.3 10*3/uL (ref 3.6–11.0)

## 2015-04-13 LAB — BASIC METABOLIC PANEL
Anion gap: 7 (ref 5–15)
BUN: 8 mg/dL (ref 6–20)
CO2: 27 mmol/L (ref 22–32)
Calcium: 9 mg/dL (ref 8.9–10.3)
Chloride: 102 mmol/L (ref 101–111)
Creatinine, Ser: 0.68 mg/dL (ref 0.44–1.00)
GFR calc Af Amer: >60 mL/min (ref >60)
Glucose, Bld: 104 mg/dL — ABNORMAL HIGH (ref 65–99)
Potassium: 3.6 mmol/L (ref 3.5–5.1)
Sodium: 136 mmol/L (ref 135–145)

## 2015-04-13 LAB — TROPONIN I
Troponin I: 0.03 ng/mL (ref <0.031)
Troponin I: <0.03 ng/mL (ref <0.031)

## 2015-04-13 MED ORDER — ASPIRIN 81 MG PO CHEW
324.0000 mg | CHEWABLE_TABLET | Freq: Once | ORAL | Status: AC
  Administered 2015-04-13: 324 mg via ORAL

## 2015-04-13 NOTE — ED Notes (Signed)
Pt informed to return if any life threatening symptoms occur.  

## 2015-04-13 NOTE — ED Notes (Signed)
Patient transported to X-ray 

## 2015-04-13 NOTE — ED Provider Notes (Signed)
CSN: [id number]     Arrival date & time 04/13/15  0805 History   None    Chief Complaint  Patient presents with  . Chest Pain   (Consider location/radiation/quality/duration/timing/severity/associated sxs/prior Treatment) HPI: She presents today with symptoms of left-sided chest pain under the left breast since last night. Patient states that she was unable to sleep due to the pain. The pain is intermittent. She denies any shortness of breath, nausea, vomiting, abdominal pain, diaphoresis. Patient denies any history of cardiac problems in the past. 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 She currently has no pain here in the office.   Past Medical History  Diagnosis Date  . Cancer [facility])     Carcinoid tumor of small intestine, malignant   . 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 Hypothyroidism   . GERD (gastroesophageal reflux disease)   . Peptic ulcer disease   . Anxiety   . Diverticulosis    Past Surgical History  Procedure Laterality Date  . Colon surgery      Carcinoid tumor small intestine s/p resection  . Melanoma excision    . Thyroidectomy    . Colonoscopy with propofol N/A 10/19/2014    Procedure: COLONOSCOPY WITH PROPOFOL;  Surgeon: Lollie Sails, MD;  Location: [facility] ENDOSCOPY;  Service: Endoscopy;  Laterality: N/A;  . Esophagogastroduodenoscopy N/A 10/19/2014    Procedure: ESOPHAGOGASTRODUODENOSCOPY (EGD);  Surgeon: Lollie Sails, MD;  Location: [facility] ENDOSCOPY;  Service: Endoscopy;  Laterality: N/A;  . Colonoscopy with propofol N/A 10/21/2014    Procedure: COLONOSCOPY WITH PROPOFOL;  Surgeon: Lollie Sails, MD;  Location: [facility] ENDOSCOPY;  Service: Endoscopy;  Laterality: N/A;   History reviewed. No pertinent family history. Social History  Substance Use Topics  . Smoking status: Never Smoker   . Smokeless tobacco: Never Used  . Alcohol Use: No   OB History    No data available      Review of Systems : Negative except mentioned above.  Allergies  Ivp dye; Novocain; Other; Penicillins; Phenergan; and Prednisone  Home Medications   Prior to Admission medications   Medication Sig Start Date End Date Taking? Authorizing Provider  ALPRAZolam Duanne Moron) 0.5 MG tablet Take 0.5 mg by mouth daily as needed for anxiety.    Historical Provider, MD  ALPRAZolam Duanne Moron) 0.5 MG tablet Take 0.5 mg by mouth at bedtime as needed for anxiety.    Historical Provider, MD  dicyclomine (BENTYL) 10 MG capsule Take 10 mg by mouth 4 (four) times daily as needed for spasms.    Historical Provider, MD  levothyroxine (SYNTHROID, LEVOTHROID) 100 MCG tablet Take 100 mcg by mouth daily before breakfast.    Historical Provider, MD  Multiple Vitamin (MULTIVITAMIN) tablet Take 1 tablet by mouth daily.    Historical Provider, MD  omeprazole (PRILOSEC) 20 MG capsule Take 20 mg by mouth daily.    Historical Provider, MD  sucralfate (CARAFATE) 1 GM/10ML suspension Take 1 g by mouth 4 (four) times daily -  with meals and at bedtime.    Historical Provider, MD  triamcinolone cream (KENALOG) 0.1 % Apply 1 application topically 2 (two) times daily as needed.    Historical Provider, MD   Meds Ordered and Administered this Visit  Medications - No data to display  BP 152/81 mmHg  Pulse 107  Temp(Src) 97.9 F (36.6 C) (Tympanic)  Resp 16  SpO2 100% No data found.  Physical Exam   GENERAL: NAD HEENT: no pharyngeal erythema, no exudate RESP: CTA B, no reproduction of pain to palpation  CARD: RRR ABD: +BS, NT/ND NEURO: CN II-XII grossly intact   ED Course  Procedures (including critical care time)  ECG- NSR, left atrial enlargement, incomplete right bundle branch block, no ST elevation or depression noted   MDM   A/P: Chest Pain- patient was given 4 baby aspirin, EKG was done, EMS was called for transport to the ER for further evaluation and treatment. IV and O2 will be started in the EMS truck.  Charge nurse at the ER was informed that patient was coming.     Paulina Fusi, MD 04/13/15 [phone]

## 2015-04-13 NOTE — ED Notes (Signed)
Pt returned from chest xray, sitting in bed in no acute distress

## 2015-04-13 NOTE — ED Notes (Signed)
[place] sandwich tray provided to patient per MD

## 2015-04-13 NOTE — ED Provider Notes (Signed)
[facility] Emergency Department Provider Note  ____________________________________________  Time seen: Seen upon arrival to the emergency department  I have reviewed the triage vital signs and the nursing notes.   HISTORY  Chief Complaint Chest Pain    HPI Savannah Riley is a 75 y.o. female with a history of carcinoid tumor who is presenting today with 1 day of admission chest pain. She says the pain is to the left lower chest and started yesterday when she was bending over getting out of the bathtub. She says that she has had several more episodes of the pain only lasting several seconds. She says the pain is sharp and fleeting. It is nonradiating. It is not worsened with exertion. She has no associated shortness of breath, nausea vomiting or diaphoresis. He should also has no history of cardiac disease. She does say that ever since this past September when she had an abdominal surgery that she has had a racing heartbeat intermittently. She denies this now as well as any pain at this time. She was sent over from urgent care this morning for further evaluation. She says that in the past she has seen a cardiologist for a racing heartbeat, but is not on any medication at this time because when she was placed on medication heart rate became too slow.Denies that the symptoms worsen with exertion or movement.   Past Medical History  Diagnosis Date  . Cancer [facility])     Carcinoid tumor of small intestine, malignant   . 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)   . Melanoma of lower limb ([place])   . Hypothyroidism   . GERD (gastroesophageal reflux disease)   . Peptic ulcer disease   . Anxiety   . Diverticulosis     There are no active problems to display for this patient.   Past Surgical History  Procedure Laterality Date  . Colon surgery      Carcinoid tumor small intestine s/p resection  . Melanoma excision    . Thyroidectomy    . Colonoscopy with propofol N/A  10/19/2014    Procedure: COLONOSCOPY WITH PROPOFOL;  Surgeon: Lollie Sails, MD;  Location: [facility] ENDOSCOPY;  Service: Endoscopy;  Laterality: N/A;  . Esophagogastroduodenoscopy N/A 10/19/2014    Procedure: ESOPHAGOGASTRODUODENOSCOPY (EGD);  Surgeon: Lollie Sails, MD;  Location: [facility] ENDOSCOPY;  Service: Endoscopy;  Laterality: N/A;  . Colonoscopy with propofol N/A 10/21/2014    Procedure: COLONOSCOPY WITH PROPOFOL;  Surgeon: Lollie Sails, MD;  Location[facility] ENDOSCOPY;  Service: Endoscopy;  Laterality: N/A;    Current Outpatient Rx  Name  Route  Sig  Dispense  Refill  . ALPRAZolam (XANAX) 0.5 MG tablet   Oral   Take 0.5 mg by mouth at bedtime as needed for anxiety.         Marland Kitchen levothyroxine (SYNTHROID, LEVOTHROID) 100 MCG tablet   Oral   Take 100 mcg by mouth daily before breakfast.         . Multiple Vitamin (MULTIVITAMIN) tablet   Oral   Take 1 tablet by mouth daily.         Marland Kitchen omeprazole (PRILOSEC) 20 MG capsule   Oral   Take 20 mg by mouth daily.           Allergies Ivp dye; Novocain; Other; Penicillins; Phenergan; and Prednisone  No family history on file.  Social History Social History  Substance Use Topics  . Smoking status: Never Smoker   . Smokeless tobacco: Never Used  .  Alcohol Use: No    Review of Systems Constitutional: No fever/chills Eyes: No visual changes. ENT: No sore throat. Cardiovascular: As above Respiratory: Denies shortness of breath. Gastrointestinal: No abdominal pain.  No nausea, no vomiting.  No diarrhea.  No constipation. Genitourinary: Negative for dysuria. Musculoskeletal: Negative for back pain. Skin: Negative for rash. Neurological: Negative for headaches, focal weakness or numbness.  10-point ROS otherwise negative.  ____________________________________________   PHYSICAL EXAM:  VITAL SIGNS: ED Triage Vitals  Enc Vitals Group     BP 04/13/15 0916 141/77 mmHg     Pulse Rate 04/13/15 0916 80     Resp  04/13/15 0916 18     Temp 04/13/15 0916 98.4 F (36.9 C)     Temp Source 04/13/15 0916 Oral     SpO2 04/13/15 0914 98 %     Weight 04/13/15 0916 140 lb (63.504 kg)     Height 04/13/15 0916 5\' 2"  (1.575 m)     Head Cir --      Peak Flow --      Pain Score 04/13/15 0917 0     Pain Loc --      Pain Edu? --      Excl. in [place]? --     Constitutional: Alert and oriented. Well appearing and in no acute distress. Eyes: Conjunctivae are normal. PERRL. EOMI. Head: Atraumatic. Nose: No congestion/rhinnorhea. Mouth/Throat: Mucous membranes are moist.   Neck: No stridor.   Cardiovascular: Normal rate, regular rhythm. Grossly normal heart sounds.  Good peripheral circulation. Chest pain is not reproducible palpation. Respiratory: Normal respiratory effort.  No retractions. Lungs CTAB. Gastrointestinal: Soft and nontender. No distention. No abdominal bruits. No CVA tenderness. Musculoskeletal: No lower extremity tenderness nor edema.  No joint effusions. Neurologic:  Normal speech and language. No gross focal neurologic deficits are appreciated. No gait instability. Skin:  Skin is warm, dry and intact. No rash noted. Psychiatric: Mood and affect are normal. Speech and behavior are normal.  ____________________________________________   LABS (all labs ordered are listed, but only abnormal results are displayed)  Labs Reviewed  CBC WITH DIFFERENTIAL/PLATELET - Abnormal; Notable for the following:    RDW 15.3 (*)    All other components within normal limits  BASIC METABOLIC PANEL - Abnormal; Notable for the following:    Glucose, Bld 104 (*)    All other components within normal limits  TROPONIN I  TROPONIN I  TROPONIN I   ____________________________________________  EKG  ED ECG REPORT I, Doran Stabler, the attending physician, personally viewed and interpreted this ECG.   Date: 04/13/2015  EKG Time: 818, this EKG is from the [place] urgent care   Rate: 75  Rhythm: normal  sinus rhythm  Axis: Normal  Intervals:none  ST&T Change: No ST segment elevation or depression. T-wave inversion in lead V2 which is likely related to lead placement.  ED ECG REPORT I, Doran Stabler, the attending physician, personally viewed and interpreted this ECG.   Date: 04/13/2015  EKG Time: 911  Rate: 75  Rhythm: normal EKG, normal sinus rhythm  Axis: Normal axis  Intervals:none  ST&T Change: No ST segment elevation or depression. No abnormal T-wave inversion.   ____________________________________________  RADIOLOGY  No active cardiopulmonary disease seen on the chest x-ray. ____________________________________________   PROCEDURES    ____________________________________________   INITIAL IMPRESSION / ASSESSMENT AND PLAN / ED COURSE  Pertinent labs & imaging results that were available during my care of the patient were reviewed by  me and considered in my medical decision making (see chart for details).  Possibly musculoskeletal chest pain. History very atypical for myocardial infarction, aortic catastrophe or pulmonary embolus. Patient is comfortable and without any symptoms at the time of the history and physical.  ----------------------------------------- 2:52 PM on 04/13/2015 -----------------------------------------  Patient remains chest pain-free at this time. Is resting comfortably. Very reassuring cardiac workup. Also with a very atypical story for cardiac chest pain. The patient has been complaining of bubbling in her stomach. Possibly pressure from this causing her chest pain. She will follow-up with Dr. Satira Mccallum. She'll be discharged home. Did discuss her metastatic carcinoid which she was very concerned about. However, I doubt this is the cause of her pain at this time. ____________________________________________   FINAL CLINICAL IMPRESSION(S) / ED DIAGNOSES  Chest pain.    Orbie Pyo, MD 04/13/15 [phone]

## 2015-04-13 NOTE — ED Notes (Signed)
Arrives from [place] urgent care via EMS, pt states left sided chest pain that began last night and states off and on pain throughout the night, states she tried to take antacids but with no relief, urgent care gave 324 asa, pt states lack of appeitie but denies any nausea or sob, states hx of carcinoid on her aorta, pt awake and alert upon arrival with no pain at this time

## 2015-04-13 NOTE — ED Notes (Signed)
Patient c/o chest pain off and on since 5pm last night.  Patient denies pain at this time.  Patient reports nausea.  Patient denies vomiting.

## 2015-04-27 ENCOUNTER — Other Ambulatory Visit
Admission: RE | Admit: 2015-04-27 | Discharge: 2015-04-27 | Disposition: A | Discharge status: Home / Self Care | Payer: Medicare Other | Source: Ambulatory Visit | Attending: Gastroenterology | Admitting: Gastroenterology

## 2015-04-27 DIAGNOSIS — R197 Diarrhea, unspecified: Secondary | ICD-10-CM | POA: Diagnosis present

## 2015-04-27 LAB — GASTROINTESTINAL PANEL BY PCR, STOOL (REPLACES STOOL CULTURE)
ASTROVIRUS: NOT DETECTED
Adenovirus F40/41: NOT DETECTED
Campylobacter species: NOT DETECTED
Cryptosporidium: NOT DETECTED
Cyclospora cayetanensis: NOT DETECTED
E. coli O157: NOT DETECTED
ENTEROPATHOGENIC E COLI (EPEC): NOT DETECTED
ENTEROTOXIGENIC E COLI (ETEC): NOT DETECTED
Entamoeba histolytica: NOT DETECTED
Enteroaggregative E coli (EAEC): NOT DETECTED
Giardia lamblia: NOT DETECTED
NOROVIRUS GI/GII: NOT DETECTED
Plesimonas shigelloides: NOT DETECTED
ROTAVIRUS A: NOT DETECTED
SAPOVIRUS (I, II, IV, AND V): NOT DETECTED
SHIGA LIKE TOXIN PRODUCING E COLI (STEC): NOT DETECTED
Salmonella species: NOT DETECTED
Shigella/Enteroinvasive E coli (EIEC): NOT DETECTED
VIBRIO CHOLERAE: NOT DETECTED
Vibrio species: NOT DETECTED
Yersinia enterocolitica: NOT DETECTED

## 2015-06-19 IMAGING — CR DG KNEE COMPLETE 4+V*R*
1 series · 3 of 3 positions shown · non-contrast
Comparison: None.

CLINICAL DATA: Right knee pain post fall at store today

EXAM:
RIGHT KNEE - COMPLETE 4+ VIEW

[Series 1: dxr knee rt comp with obliques · 0.14mm/px · 3 of 3 slices shown]
[im 1/3]
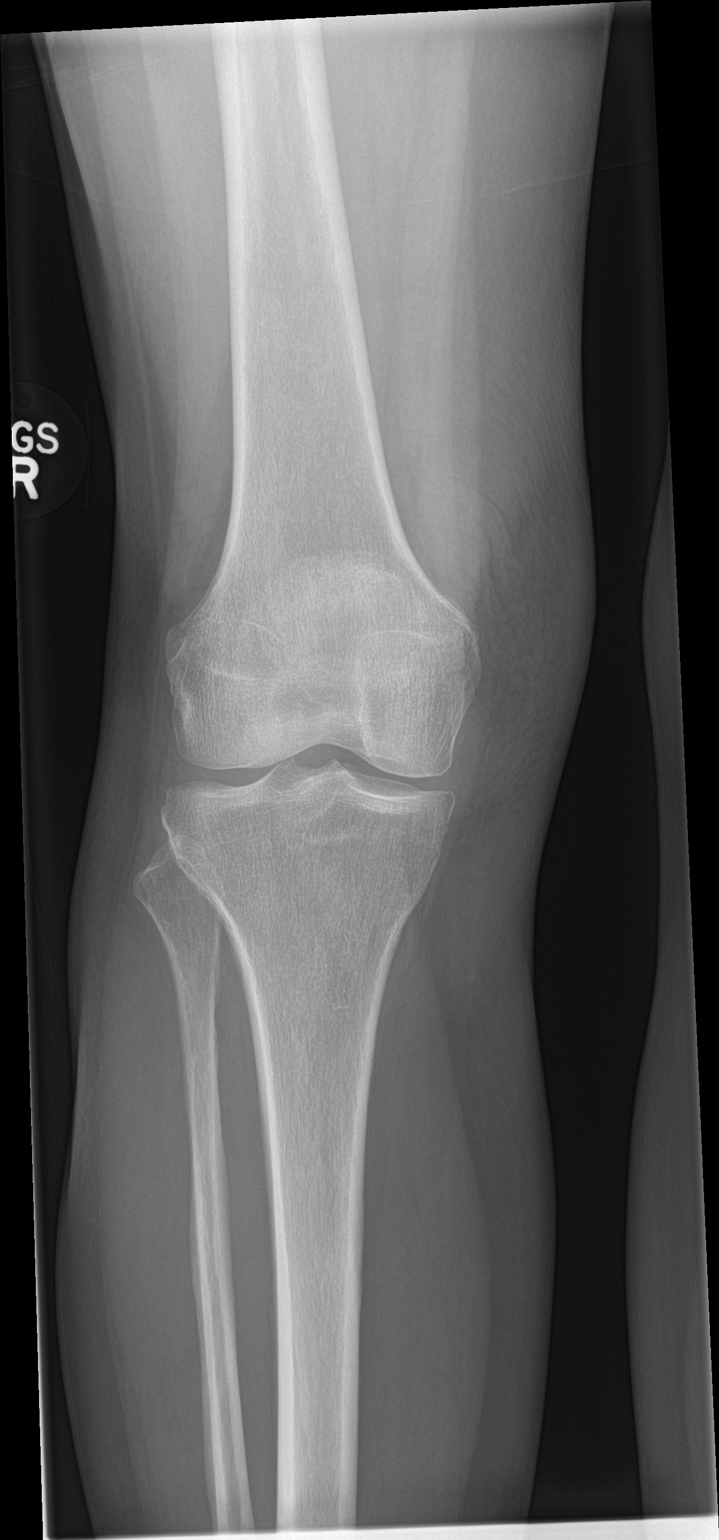
[im 2/3]
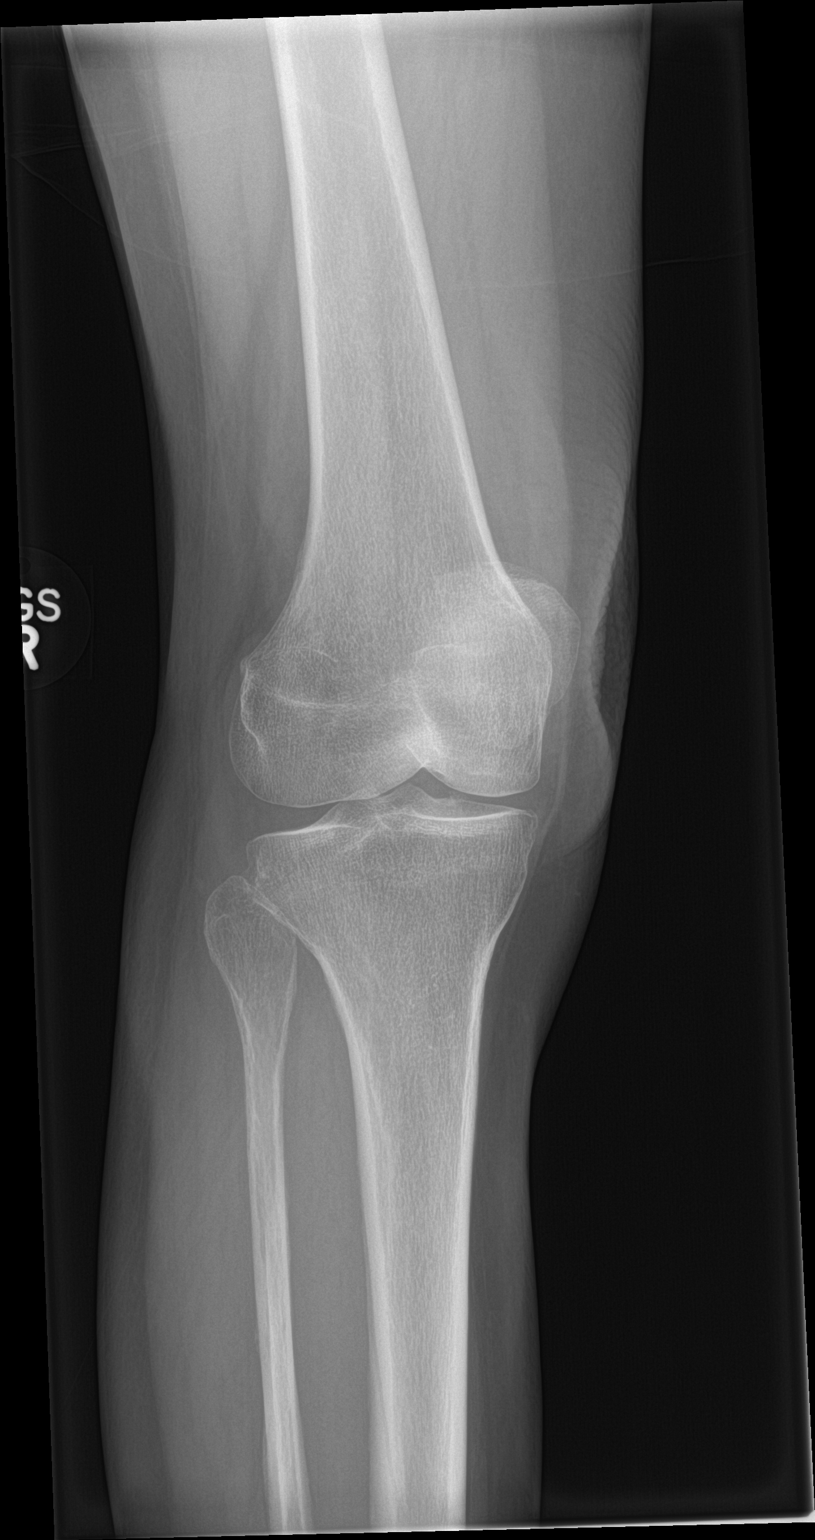
[im 3/3]
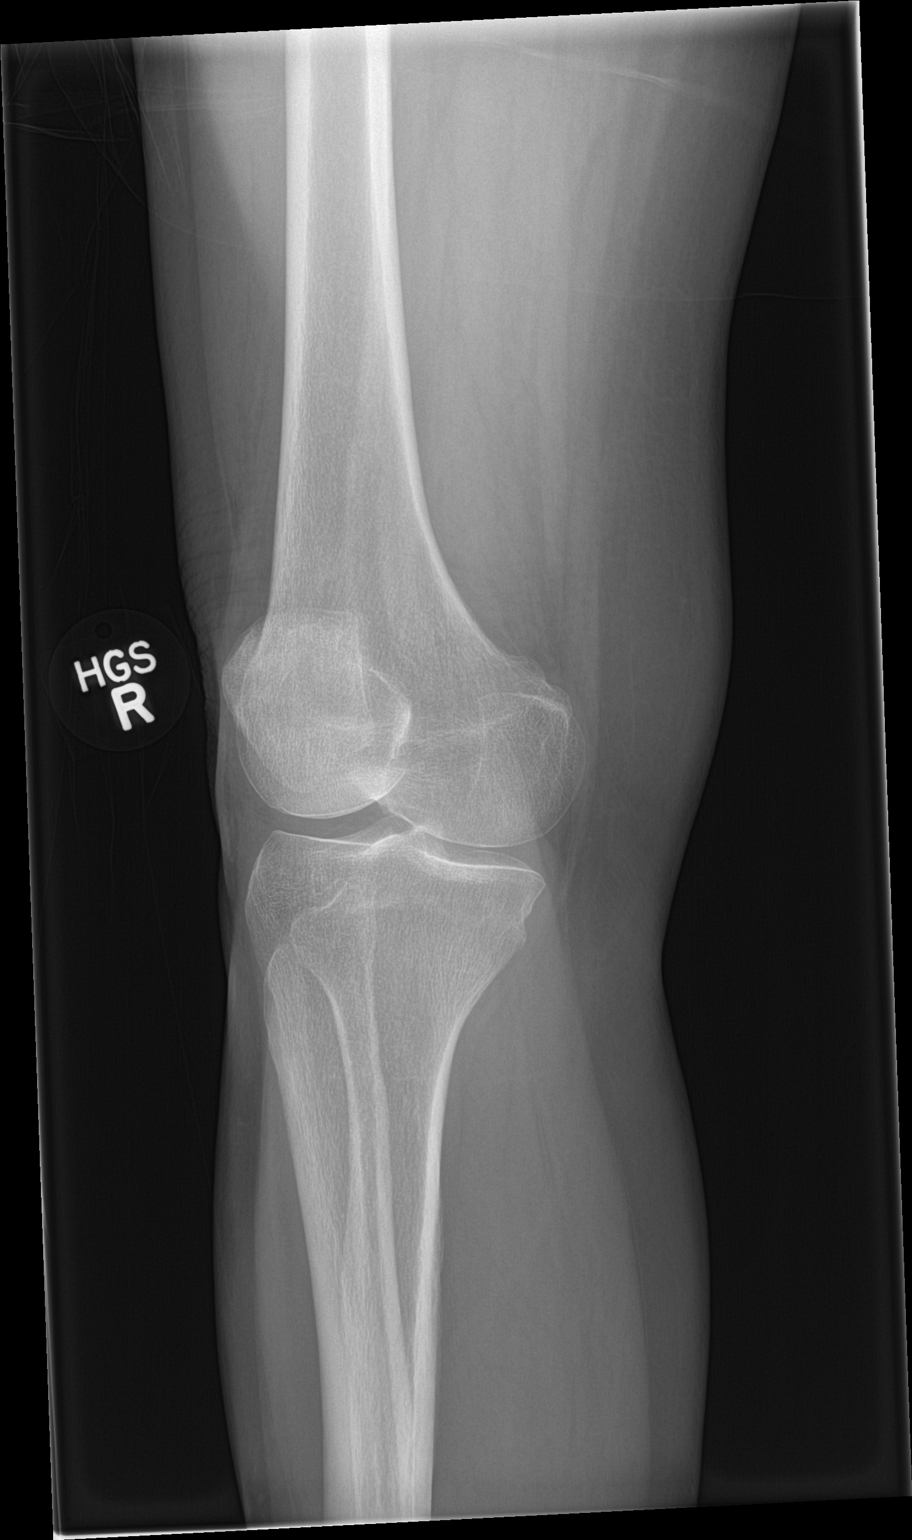

[3 of 3 positions shown; findings below may reference images not displayed]

FINDINGS: Four views of the right knee submitted. No acute fracture or
subluxation. There is diffuse osteopenia. Mild narrowing of
patellofemoral joint space. Mild spurring of patella. No joint
effusion.
IMPRESSION: No acute fracture or subluxation. Diffuse osteopenia. Minimal
osteoarthritic changes.

## 2015-07-26 ENCOUNTER — Other Ambulatory Visit: Payer: Self-pay | Admitting: Nurse Practitioner

## 2015-07-26 DIAGNOSIS — M7989 Other specified soft tissue disorders: Secondary | ICD-10-CM

## 2015-07-26 DIAGNOSIS — S8011XA Contusion of right lower leg, initial encounter: Secondary | ICD-10-CM

## 2015-07-27 ENCOUNTER — Ambulatory Visit
Admission: RE | Admit: 2015-07-27 | Discharge: 2015-07-27 | Disposition: A | Discharge status: Home / Self Care | Payer: Medicare Other | Source: Ambulatory Visit | Attending: Nurse Practitioner | Admitting: Nurse Practitioner

## 2015-07-27 DIAGNOSIS — M7989 Other specified soft tissue disorders: Secondary | ICD-10-CM

## 2015-07-27 DIAGNOSIS — M799 Soft tissue disorder, unspecified: Secondary | ICD-10-CM | POA: Diagnosis present

## 2015-07-27 DIAGNOSIS — S8011XA Contusion of right lower leg, initial encounter: Secondary | ICD-10-CM | POA: Insufficient documentation

## 2015-08-02 ENCOUNTER — Other Ambulatory Visit: Payer: Self-pay | Admitting: Family Medicine

## 2015-08-02 DIAGNOSIS — Z1231 Encounter for screening mammogram for malignant neoplasm of breast: Secondary | ICD-10-CM

## 2015-08-08 ENCOUNTER — Ambulatory Visit
Admission: RE | Admit: 2015-08-08 | Discharge: 2015-08-08 | Disposition: A | Discharge status: Home / Self Care | Payer: Medicare Other | Source: Ambulatory Visit | Attending: Family Medicine | Admitting: Family Medicine

## 2015-08-08 DIAGNOSIS — Z1231 Encounter for screening mammogram for malignant neoplasm of breast: Secondary | ICD-10-CM

## 2016-06-27 ENCOUNTER — Other Ambulatory Visit: Payer: Self-pay | Admitting: Family Medicine

## 2016-06-27 DIAGNOSIS — Z1231 Encounter for screening mammogram for malignant neoplasm of breast: Secondary | ICD-10-CM

## 2016-08-08 ENCOUNTER — Ambulatory Visit
Admission: RE | Admit: 2016-08-08 | Discharge: 2016-08-08 | Disposition: A | Discharge status: Home / Self Care | Payer: Medicare Other | Source: Ambulatory Visit | Attending: Family Medicine | Admitting: Family Medicine

## 2016-08-08 DIAGNOSIS — Z1231 Encounter for screening mammogram for malignant neoplasm of breast: Secondary | ICD-10-CM | POA: Diagnosis not present

## 2016-08-08 HISTORY — DX: Personal history of irradiation: Z92.3

## 2017-07-19 ENCOUNTER — Ambulatory Visit
Admission: EM | Admit: 2017-07-19 | Discharge: 2017-07-19 | Disposition: A | Discharge status: Home / Self Care | Payer: Medicare Other | Attending: Emergency Medicine | Admitting: Emergency Medicine

## 2017-07-19 ENCOUNTER — Encounter: Payer: Self-pay | Admitting: Gynecology

## 2017-07-19 ENCOUNTER — Other Ambulatory Visit: Payer: Self-pay

## 2017-07-19 DIAGNOSIS — Z884 Allergy status to anesthetic agent status: Secondary | ICD-10-CM | POA: Insufficient documentation

## 2017-07-19 DIAGNOSIS — Z85820 Personal history of malignant melanoma of skin: Secondary | ICD-10-CM | POA: Insufficient documentation

## 2017-07-19 DIAGNOSIS — Z91041 Radiographic dye allergy status: Secondary | ICD-10-CM | POA: Insufficient documentation

## 2017-07-19 DIAGNOSIS — Z79899 Other long term (current) drug therapy: Secondary | ICD-10-CM | POA: Insufficient documentation

## 2017-07-19 DIAGNOSIS — Z8711 Personal history of peptic ulcer disease: Secondary | ICD-10-CM | POA: Insufficient documentation

## 2017-07-19 DIAGNOSIS — K219 Gastro-esophageal reflux disease without esophagitis: Secondary | ICD-10-CM | POA: Diagnosis not present

## 2017-07-19 DIAGNOSIS — F419 Anxiety disorder, unspecified: Secondary | ICD-10-CM | POA: Insufficient documentation

## 2017-07-19 DIAGNOSIS — Z85850 Personal history of malignant neoplasm of thyroid: Secondary | ICD-10-CM | POA: Diagnosis not present

## 2017-07-19 DIAGNOSIS — R002 Palpitations: Secondary | ICD-10-CM | POA: Insufficient documentation

## 2017-07-19 DIAGNOSIS — Z923 Personal history of irradiation: Secondary | ICD-10-CM | POA: Insufficient documentation

## 2017-07-19 DIAGNOSIS — E890 Postprocedural hypothyroidism: Secondary | ICD-10-CM | POA: Insufficient documentation

## 2017-07-19 DIAGNOSIS — Z880 Allergy status to penicillin: Secondary | ICD-10-CM | POA: Diagnosis not present

## 2017-07-19 DIAGNOSIS — Z79890 Hormone replacement therapy (postmenopausal): Secondary | ICD-10-CM | POA: Insufficient documentation

## 2017-07-19 DIAGNOSIS — Z803 Family history of malignant neoplasm of breast: Secondary | ICD-10-CM | POA: Insufficient documentation

## 2017-07-19 DIAGNOSIS — Z888 Allergy status to other drugs, medicaments and biological substances status: Secondary | ICD-10-CM | POA: Insufficient documentation

## 2017-07-19 DIAGNOSIS — T50905A Adverse effect of unspecified drugs, medicaments and biological substances, initial encounter: Secondary | ICD-10-CM | POA: Diagnosis not present

## 2017-07-19 NOTE — ED Provider Notes (Signed)
HPI  SUBJECTIVE:  Savannah Riley is a 77 y.o. female who presents with 5-minute episode of palpitations 30 minutes after taking Zofran. not Sure if they were iregular, but she states that her heart was "beating fast".  She was recently started on Zofran because of nausea due to her increased fentanyl dose 10 days ago.  She is currently on 50 mcg of fentanyl for cancer related pain.  She states that the palpitations had slowed down by the time she arrived to the department.  no chest pain, pressure, heaviness, diaphoresis, syncope, presyncope, weakness, abdominal pain.  No other new medications.  No excess caffeine.  No shortness of breath.  No aggravating or alleviating factors.  She has not tried anything for this.  She has a past medical history of carcinoid tumor, "an arrhythmia" for which she has seen a cardiologist, but has not required medications for this in some time.  No history of atrial fibrillation, SVT, MI, diabetes, hypertension, PE, DVT.  [id number], Chrissie Noa, MD   Past Medical History:  Diagnosis Date  . Anxiety   . Cancer [facility])    Carcinoid tumor of small intestine, malignant   . Diverticulosis   . GERD (gastroesophageal reflux disease)   . Hypothyroidism   . 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 Peptic ulcer disease   . Personal history of radiation therapy     Past Surgical History:  Procedure Laterality Date  . COLON SURGERY     Carcinoid tumor small intestine s/p resection  . COLONOSCOPY WITH PROPOFOL N/A 10/19/2014   Procedure: COLONOSCOPY WITH PROPOFOL;  Surgeon: Lollie Sails, MD;  Location: [facility] ENDOSCOPY;  Service: Endoscopy;  Laterality: N/A;  . COLONOSCOPY WITH PROPOFOL N/A 10/21/2014   Procedure: COLONOSCOPY WITH PROPOFOL;  Surgeon: Lollie Sails, MD;  Location: [facility] ENDOSCOPY;  Service: Endoscopy;  Laterality: N/A;  . ESOPHAGOGASTRODUODENOSCOPY N/A 10/19/2014   Procedure: ESOPHAGOGASTRODUODENOSCOPY (EGD);  Surgeon:  Lollie Sails, MD;  [facility] ENDOSCOPY;  Service: Endoscopy;  Laterality: N/A;  . MELANOMA EXCISION    . THYROIDECTOMY      Family History  Problem Relation Age of Onset  . Breast cancer Sister 11    Social History   Tobacco Use  . Smoking status: Never Smoker  . Smokeless tobacco: Never Used  Substance Use Topics  . Alcohol use: No  . Drug use: No    No current facility-administered medications for this encounter.   Current Outpatient Medications:  .  ALPRAZolam (XANAX) 0.5 MG tablet, Take 0.5 mg by mouth at bedtime as needed for anxiety., Disp: , Rfl:  .  levothyroxine (SYNTHROID, LEVOTHROID) 100 MCG tablet, Take 100 mcg by mouth daily before breakfast., Disp: , Rfl:  .  Multiple Vitamin (MULTIVITAMIN) tablet, Take 1 tablet by mouth daily., Disp: , Rfl:  .  omeprazole (PRILOSEC) 20 MG capsule, Take 20 mg by mouth daily., Disp: , Rfl:   Allergies  Allergen Reactions  . Ivp Dye [Iodinated Diagnostic Agents]   . Novocain [Procaine]   . Other   . Penicillins   . Phenergan [Promethazine Hcl]   . Prednisone      ROS  As noted in HPI.   Physical Exam  BP (!) 147/77 (BP Location: Left Arm)   Pulse 94   Temp 98.4 F (36.9 C) (Oral)   Resp 16   Ht 5\' 2"  (1.575 m)   Wt 125 lb (56.7 kg)   SpO2 100%  BMI 22.86 kg/m   Constitutional: Well developed, well nourished, no acute distress Eyes:  EOMI, conjunctiva normal bilaterally HENT: Normocephalic, atraumatic,mucus membranes moist Respiratory: Normal inspiratory effort, lungs clear bilaterally Cardiovascular: Normal rate no murmurs, rubs, gallops.  RP, DP 2+ and equal bilaterally GI: nondistended skin: No rash, skin intact Musculoskeletal: no deformities Neurologic: Alert & oriented x 3, no focal neuro deficits Psychiatric: Speech and behavior appropriate   ED Course   Medications - No data to display  Orders Placed This Encounter  Procedures  . ED EKG    Tachycardia    Standing Status:    Standing    Number of Occurrences:   1    Order Specific Question:   Reason for Exam    Answer:   Other (See Comments)  . ED EKG    Standing Status:   Standing    Number of Occurrences:   1    Order Specific Question:   Reason for Exam    Answer:   Other (See Comments)  . EKG 12-Lead    Standing Status:   Standing    Number of Occurrences:   1  . EKG 12-Lead    Standing Status:   Standing    Number of Occurrences:   1    No results found for this or any previous visit (from the past 24 hour(s)). No results found.  ED Clinical Impression  Palpitations  Adverse effect of drug, initial encounter   ED Assessment/Plan  Vitals are normal here.  She is asymptomatic.  EKG: Normal sinus rhythm.  Single PAC.  Normal intervals.  No hypertrophy.  No ST-T wave changes consistent with ischemia compared to EEG from 03/2015  Suspect palpitations are from the increased dose of the fentanyl although she is on 2 other medications that can prolong the QT, tramadol and Zofran.  Doubt STEMI, PE.  She only is taking the Zofran because of the increased fentanyl.  She will talk to her doctor about decreasing the fentanyl and possibly another nausea medicine.  Discussed  treatment plan, and plan for follow-up with patient. Discussed sn/sx that should prompt return to the ED. patient agrees with plan.   No orders of the defined types were placed in this encounter.   *This clinic note was created using Dragon dictation software. Therefore, there may be occasional mistakes despite careful proofreading.   ?   Melynda Ripple, MD 07/19/17 [phone]

## 2017-07-19 NOTE — ED Triage Notes (Signed)
Patient c/o that half an hour after taking her Zofran 8 mg tablet she felt her heart beating rapidly.

## 2017-07-19 NOTE — Discharge Instructions (Addendum)
I think that this is most likely from the fentanyl.  Talk to your doctor about decreasing the dose.  Also talk to your doctor about possibly changing the nausea medicine.

## 2018-07-18 DEATH — deceased
